# Patient Record
Sex: Male | Born: 1985 | Race: White | Hispanic: No | Marital: Married | Smoking: Never smoker
Health system: Southern US, Community
[De-identification: ages and names within clinical notes are randomized; demographics above are authoritative.]

## PROBLEM LIST (undated history)

## (undated) DIAGNOSIS — M545 Low back pain, unspecified: Secondary | ICD-10-CM

## (undated) DIAGNOSIS — I499 Cardiac arrhythmia, unspecified: Secondary | ICD-10-CM

---

## 2007-03-07 ENCOUNTER — Emergency Department: Payer: Self-pay | Admitting: Emergency Medicine

## 2007-03-10 ENCOUNTER — Emergency Department: Payer: Self-pay | Admitting: Emergency Medicine

## 2009-01-03 ENCOUNTER — Emergency Department: Payer: Self-pay | Admitting: Emergency Medicine

## 2009-04-27 ENCOUNTER — Emergency Department: Payer: Self-pay | Admitting: Emergency Medicine

## 2009-09-10 ENCOUNTER — Emergency Department: Payer: Self-pay | Admitting: Emergency Medicine

## 2009-09-12 ENCOUNTER — Emergency Department: Payer: Self-pay | Admitting: Emergency Medicine

## 2010-02-17 ENCOUNTER — Emergency Department: Payer: Self-pay | Admitting: Emergency Medicine

## 2010-08-31 ENCOUNTER — Encounter: Payer: Self-pay | Admitting: Family Medicine

## 2010-09-25 ENCOUNTER — Emergency Department: Payer: Self-pay | Admitting: Emergency Medicine

## 2010-09-27 ENCOUNTER — Encounter: Payer: Self-pay | Admitting: Family Medicine

## 2012-03-17 ENCOUNTER — Emergency Department: Payer: Self-pay | Admitting: Emergency Medicine

## 2012-03-22 ENCOUNTER — Emergency Department: Payer: Self-pay | Admitting: Emergency Medicine

## 2012-07-16 ENCOUNTER — Emergency Department: Payer: Self-pay | Admitting: Emergency Medicine

## 2013-09-30 ENCOUNTER — Ambulatory Visit: Payer: Self-pay | Admitting: Podiatry

## 2014-12-01 ENCOUNTER — Emergency Department: Payer: Self-pay | Admitting: Emergency Medicine

## 2014-12-01 LAB — URINALYSIS, COMPLETE
Bacteria: NONE SEEN
Bilirubin,UR: NEGATIVE
Blood: NEGATIVE
GLUCOSE, UR: NEGATIVE mg/dL (ref 0–75)
Ketone: NEGATIVE
LEUKOCYTE ESTERASE: NEGATIVE
NITRITE: NEGATIVE
Ph: 6 (ref 4.5–8.0)
Protein: NEGATIVE
SPECIFIC GRAVITY: 1.049 (ref 1.003–1.030)
Squamous Epithelial: <1

## 2014-12-01 LAB — CBC
HCT: 42.3 % (ref 40.0–52.0)
HGB: 14.5 g/dL (ref 13.0–18.0)
MCH: 30.8 pg (ref 26.0–34.0)
MCHC: 34.2 g/dL (ref 32.0–36.0)
MCV: 90 fL (ref 80–100)
PLATELETS: 187 10*3/uL (ref 150–440)
RBC: 4.69 10*6/uL (ref 4.40–5.90)
RDW: 13.3 % (ref 11.5–14.5)
WBC: 12.4 10*3/uL — ABNORMAL HIGH (ref 3.8–10.6)

## 2014-12-01 LAB — COMPREHENSIVE METABOLIC PANEL
ALBUMIN: 4 g/dL (ref 3.4–5.0)
ALK PHOS: 61 U/L
ANION GAP: 6 — AB (ref 7–16)
AST: 40 U/L — AB (ref 15–37)
BUN: 18 mg/dL (ref 7–18)
Bilirubin,Total: 0.7 mg/dL (ref 0.2–1.0)
CALCIUM: 9 mg/dL (ref 8.5–10.1)
CO2: 28 mmol/L (ref 21–32)
Chloride: 105 mmol/L (ref 98–107)
Creatinine: 1.23 mg/dL (ref 0.60–1.30)
EGFR (African American): >60
Glucose: 128 mg/dL — ABNORMAL HIGH (ref 65–99)
Osmolality: 281 (ref 275–301)
Potassium: 3.9 mmol/L (ref 3.5–5.1)
SGPT (ALT): 56 U/L
Sodium: 139 mmol/L (ref 136–145)
Total Protein: 7.5 g/dL (ref 6.4–8.2)

## 2015-10-20 ENCOUNTER — Emergency Department
Admission: EM | Admit: 2015-10-20 | Discharge: 2015-10-20 | Disposition: A | Discharge status: Home / Self Care | Payer: BLUE CROSS/BLUE SHIELD | Attending: Student | Admitting: Student

## 2015-10-20 ENCOUNTER — Encounter: Payer: Self-pay | Admitting: Emergency Medicine

## 2015-10-20 DIAGNOSIS — J029 Acute pharyngitis, unspecified: Secondary | ICD-10-CM | POA: Diagnosis present

## 2015-10-20 DIAGNOSIS — B9789 Other viral agents as the cause of diseases classified elsewhere: Secondary | ICD-10-CM | POA: Insufficient documentation

## 2015-10-20 DIAGNOSIS — J028 Acute pharyngitis due to other specified organisms: Secondary | ICD-10-CM | POA: Insufficient documentation

## 2015-10-20 LAB — POCT RAPID STREP A: Streptococcus, Group A Screen (Direct): NEGATIVE

## 2015-10-20 NOTE — ED Provider Notes (Signed)
[facility] Emergency Department Provider Note  ____________________________________________  Time seen: Approximately 1:48 PM  I have reviewed the triage vital signs and the nursing notes.   HISTORY  Chief Complaint Sore Throat   HPI Reginald Terrell is a 29 y.o. male patient is here today with complaint of sore throat which he states he woke up with this morning. He states that this improved after taking a hot shower. He also has had some nasal congestion. He denies any known fever. 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 Currently he rates his pain as 5 out of 10.   History reviewed. No pertinent past medical history.  There are no active problems to display for this patient.   History reviewed. No pertinent past surgical history.  No current outpatient prescriptions on file.  Allergies Review of patient's allergies indicates not on file.  No family history on file.  Social History Social History  Substance Use Topics  . Smoking status: Never Smoker   . Smokeless tobacco: None  . Alcohol Use: No    Review of Systems Constitutional: No fever/chills Eyes: No visual changes. ENT: Positive sore throat. Cardiovascular: Denies chest pain. Respiratory: Denies shortness of breath. Gastrointestinal: No abdominal pain.  No nausea, no vomiting.  Genitourinary: Negative for dysuria. Musculoskeletal: Negative for back pain. Skin: Negative for rash. Neurological: Negative for headaches, focal weakness or numbness.  10-point ROS otherwise negative.  ____________________________________________   PHYSICAL EXAM:  VITAL SIGNS: ED Triage Vitals  Enc Vitals Group     BP 10/20/15 1332 167/68 mmHg     Pulse Rate 10/20/15 1330 79     Resp 10/20/15 1330 20     Temp 10/20/15 1330 97.8 F (36.6 C)     Temp Source  10/20/15 1330 Oral     SpO2 10/20/15 1330 96 %     Weight 10/20/15 1330 208 lb (94.348 kg)     Height 10/20/15 1330  (1.905 m)     Head Cir --      Peak Flow --      Pain Score 10/20/15 1331 5     Pain Loc --      Pain Edu? --      Excl. in GC? --     Constitutional: Alert and oriented. Well appearing and in no acute distress. Eyes: Conjunctivae are normal. PERRL. EOMI. Head: Atraumatic. Nose: Mild congestion/no rhinnorhea. EACs and TMs are clear bilaterally. Mouth/Throat: Mucous membranes are moist.  Oropharynx non-erythematous. Mild posterior drainage was seen. No exudate is present. Neck: No stridor.   Hematological/Lymphatic/Immunilogical: No cervical lymphadenopathy. Cardiovascular: Normal rate, regular rhythm. Grossly normal heart sounds.  Good peripheral circulation. Respiratory: Normal respiratory effort.  No retractions. Lungs CTAB. Gastrointestinal: Soft and nontender. No distention.  Musculoskeletal: No lower extremity tenderness nor edema.   Neurologic:  Normal speech and language. No gross focal neurologic deficits are appreciated. No gait instability. Skin:  Skin is warm, dry and intact. No rash noted. Psychiatric: Mood and affect are normal. Speech and behavior are normal.  ____________________________________________   LABS (all labs ordered are listed, but only abnormal results are displayed)  Labs Reviewed  CULTURE, GROUP A STREP ([facility] ONLY)  POCT RAPID STREP A    PROCEDURES  Procedure(s) performed: None  Critical Care performed: No  ____________________________________________   INITIAL IMPRESSION /  ASSESSMENT AND PLAN / ED COURSE  Pertinent labs & imaging results that were available during my care of the patient were reviewed by me and considered in my medical decision making (see chart for details).  Patient was informed that his strep test was negative. There is no erythema or exudate present. Patient was told take Tylenol or ibuprofen as  needed for throat pain. He is to increase fluids. He is also to obtain a decongestant/antihistamine such as Claritin-D or Zyrtec-D for posterior drainage. ____________________________________________   FINAL CLINICAL IMPRESSION(S) / ED DIAGNOSES  Final diagnoses:  Acute viral pharyngitis      Tommi RumpsRhonda L Summers, PA-C 10/20/15 1440  Gayla DossEryka A Gayle, MD 10/20/15 1550

## 2015-10-20 NOTE — Discharge Instructions (Signed)
OBTAIN ZYRTEC D OR CLARITIN D  INCREASE FLUIDS, TYLENOL OR IBUPROFEN FOR THROAT PAIN AS NEEDED

## 2015-10-20 NOTE — ED Notes (Signed)
Pt presents with sore throat started this am, wife was recently dx with strep and treated with antibiotics.

## 2015-10-20 NOTE — ED Notes (Signed)
Pt reports sore throat since yesterday. Reports difficulty swallowing this morning. Pt states wife and child have recently been treated for strep throat.

## 2015-10-22 LAB — CULTURE, GROUP A STREP (THRC)

## 2016-05-31 ENCOUNTER — Emergency Department: Payer: Self-pay

## 2016-05-31 ENCOUNTER — Encounter: Payer: Self-pay | Admitting: Emergency Medicine

## 2016-05-31 ENCOUNTER — Emergency Department
Admission: EM | Admit: 2016-05-31 | Discharge: 2016-05-31 | Disposition: A | Discharge status: Home / Self Care | Payer: Self-pay | Attending: Emergency Medicine | Admitting: Emergency Medicine

## 2016-05-31 DIAGNOSIS — M7918 Myalgia, other site: Secondary | ICD-10-CM

## 2016-05-31 DIAGNOSIS — M791 Myalgia: Secondary | ICD-10-CM | POA: Insufficient documentation

## 2016-05-31 DIAGNOSIS — M545 Low back pain: Secondary | ICD-10-CM

## 2016-05-31 DIAGNOSIS — M5442 Lumbago with sciatica, left side: Secondary | ICD-10-CM | POA: Insufficient documentation

## 2016-05-31 LAB — URINALYSIS COMPLETE WITH MICROSCOPIC (ARMC ONLY)
BACTERIA UA: NONE SEEN
BILIRUBIN URINE: NEGATIVE
GLUCOSE, UA: NEGATIVE mg/dL
KETONES UR: NEGATIVE mg/dL
LEUKOCYTES UA: NEGATIVE
NITRITE: NEGATIVE
Protein, ur: NEGATIVE mg/dL
SPECIFIC GRAVITY, URINE: 1.027 (ref 1.005–1.030)
Squamous Epithelial / LPF: NONE SEEN
pH: 5 (ref 5.0–8.0)

## 2016-05-31 MED ORDER — DIAZEPAM 2 MG PO TABS
2.0000 mg | ORAL_TABLET | Freq: Once | ORAL | Status: AC
  Administered 2016-05-31: 2 mg via ORAL
  Filled 2016-05-31: qty 1

## 2016-05-31 MED ORDER — ETODOLAC 200 MG PO CAPS: 200.0000 mg | ORAL_CAPSULE | Freq: Three times a day (TID) | ORAL | Status: DC

## 2016-05-31 MED ORDER — LIDOCAINE 5 % EX PTCH: 1.0000 | MEDICATED_PATCH | Freq: Two times a day (BID) | CUTANEOUS | Status: AC

## 2016-05-31 MED ORDER — DIAZEPAM 2 MG PO TABS: 2.0000 mg | ORAL_TABLET | Freq: Three times a day (TID) | ORAL | Status: AC | PRN

## 2016-05-31 MED ORDER — KETOROLAC TROMETHAMINE 60 MG/2ML IM SOLN
60.0000 mg | Freq: Once | INTRAMUSCULAR | Status: AC
  Administered 2016-05-31: 60 mg via INTRAMUSCULAR
  Filled 2016-05-31: qty 2

## 2016-05-31 MED ORDER — LIDOCAINE 5 % EX PTCH
1.0000 | MEDICATED_PATCH | CUTANEOUS | Status: DC
  Administered 2016-05-31: 1 via TRANSDERMAL
  Filled 2016-05-31: qty 1

## 2016-05-31 NOTE — ED Provider Notes (Signed)
[facility] Emergency Department Provider Note   ____________________________________________  Time seen: Approximately 3:15 AM  I have reviewed the triage vital signs and the nursing notes.   HISTORY  Chief Complaint Back Pain    HPI Reginald Terrell is a 30 y.o. male who comes into the hospital today with severe back pain. The patient reports this started 2 days ago but it was not as bad initially. He reports that he bent over and when he stood up his back was hurting. He also reports that he also slipped and fell in the shower. He reports that he's been taking ibuprofen and did take a dose of Flexeril. He helped for a little bit but the pain came back. He reports that the pain is on his left side. He felt stabbing in his back on the left and into his hips. He reports he can't stand like normal but when he is laying on the bed he is okay. He reports that the pain is so bad as like a 15 out of 10 in intensity. The patient is here for evaluation.   History reviewed. No pertinent past medical history.  There are no active problems to display for this patient.   History reviewed. No pertinent past surgical history.  Current Outpatient Rx  Name  Route  Sig  Dispense  Refill  . diazepam (VALIUM) 2 MG tablet   Oral   Take 1 tablet (2 mg total) by mouth every 8 (eight) hours as needed for anxiety.   12 tablet   0   . etodolac (LODINE) 200 MG capsule   Oral   Take 1 capsule (200 mg total) by mouth every 8 (eight) hours.   12 capsule   0   . lidocaine (LIDODERM) 5 %   Transdermal   Place 1 patch onto the skin every 12 (twelve) hours. Remove & Discard patch within 12 hours or as directed by MD   10 patch   0     Allergies Penicillins  History reviewed. No pertinent family history.  Social History Social History  Substance Use Topics  . Smoking status: Never Smoker   . Smokeless tobacco: None  . Alcohol Use: No    Review of  Systems Constitutional: No fever/chills Eyes: No visual changes. ENT: No sore throat. Cardiovascular: Denies chest pain. Respiratory: Denies shortness of breath. Gastrointestinal: No abdominal pain.  No nausea, no vomiting.  No diarrhea.  No constipation. Genitourinary: Negative for dysuria. Musculoskeletal:  back pain. Skin: Negative for rash. Neurological: Negative for headaches, focal weakness or numbness.  10-point ROS otherwise negative.  ____________________________________________   PHYSICAL EXAM:  VITAL SIGNS: ED Triage Vitals  Enc Vitals Group     BP 05/31/16 0213 112/73 mmHg     Pulse Rate 05/31/16 0213 78     Resp 05/31/16 0213 24     Temp 05/31/16 0213 97.6 F (36.4 C)     Temp Source 05/31/16 0213 Oral     SpO2 05/31/16 0213 100 %     Weight 05/31/16 0213 215 lb (97.523 kg)     Height 05/31/16 0213 6\' 3"  (1.905 m)     Head Cir --      Peak Flow --      Pain Score 05/31/16 0225 10     Pain Loc --      Pain Edu? --      Excl. in GC? --     Constitutional: Alert and oriented. Well appearing  and in mild distress. Eyes: Conjunctivae are normal. PERRL. EOMI. Head: Atraumatic. Nose: No congestion/rhinnorhea. Mouth/Throat: Mucous membranes are moist.  Oropharynx non-erythematous. Cardiovascular: Normal rate, regular rhythm. Grossly normal heart sounds.  Good peripheral circulation. Respiratory: Normal respiratory effort.  No retractions. Lungs CTAB. Gastrointestinal: Soft and nontender. No distention. Positive bowel sounds Musculoskeletal: Left low back tenderness to palpation and some midline tenderness to palpation positive straight leg raise bilaterally   Neurologic:  Normal speech and language.  Skin:  Skin is warm, dry and intact.  Psychiatric: Mood and affect are normal.  ____________________________________________   LABS (all labs ordered are listed, but only abnormal results are displayed)  Labs Reviewed  URINALYSIS COMPLETEWITH MICROSCOPIC  ([facility] ONLY) - Abnormal; Notable for the following:    Color, Urine YELLOW (*)    APPearance CLEAR (*)    Hgb urine dipstick 1+ (*)    All other components within normal limits   ____________________________________________  EKG  none ____________________________________________  RADIOLOGY  Lumbar spine x-ray: No evidence of fracture or subluxation along the lumbar spine ____________________________________________   PROCEDURES  Procedure(s) performed: None  Procedures  Critical Care performed: No  ____________________________________________   INITIAL IMPRESSION / ASSESSMENT AND PLAN / ED COURSE  Pertinent labs & imaging results that were available during my care of the patient were reviewed by me and considered in my medical decision making (see chart for details).  This is a 30 year old male who comes into the hospital today with low back pain. The patient reports that he bent over and felt something pulling in his back which is when he started having pain. The patient also did falls I will do an x-ray of his back. He'll receive a dose of Toradol, Valium and a Lidoderm patch. He will be reassessed.  Patient's pain is improved. His urinalysis is unremarkable. He'll be discharged home to follow-up with his primary care physician. ____________________________________________   FINAL CLINICAL IMPRESSION(S) / ED DIAGNOSES  Final diagnoses:  Left low back pain, with sciatica presence unspecified  Musculoskeletal pain      NEW MEDICATIONS STARTED DURING THIS VISIT:  Discharge Medication List as of 05/31/2016  6:17 AM    START taking these medications   Details  diazepam (VALIUM) 2 MG tablet Take 1 tablet (2 mg total) by mouth every 8 (eight) hours as needed for anxiety., Starting 05/31/2016, Until Thu 05/31/17, Print    etodolac (LODINE) 200 MG capsule Take 1 capsule (200 mg total) by mouth every 8 (eight) hours., Starting 05/31/2016, Until Discontinued, Print     lidocaine (LIDODERM) 5 % Place 1 patch onto the skin every 12 (twelve) hours. Remove & Discard patch within 12 hours or as directed by MD, Starting 05/31/2016, Until Thu 05/31/17, Print         Note:  This document was prepared using Dragon voice recognition software and may include unintentional dictation errors.    Rebecka ApleyAllison P Rachel Samples, MD 05/31/16 [phone]

## 2016-05-31 NOTE — ED Notes (Signed)
Discharge instructions reviewed with patient. Patient verbalized understanding. Patient taken to lobby via wheelchair to await wife to pick him up

## 2016-05-31 NOTE — ED Notes (Signed)
Pt via POV to triage c/o lower back pain, pain radiates to pt's gluteal and leg, 10/10, pt laying on floor in triage, reports pain started this am, pt took another's flexaril at midnight without effect.

## 2016-05-31 NOTE — Discharge Instructions (Signed)
Back Pain, Adult °Back pain is very common in adults. The cause of back pain is rarely dangerous and the pain often gets better over time. The cause of your back pain may not be known. Some common causes of back pain include: °· Strain of the muscles or ligaments supporting the spine. °· Wear and tear (degeneration) of the spinal disks. °· Arthritis. °· Direct injury to the back. °For many people, back pain may return. Since back pain is rarely dangerous, most people can learn to manage this condition on their own. °HOME CARE INSTRUCTIONS °Watch your back pain for any changes. The following actions may help to lessen any discomfort you are feeling: °· Remain active. It is stressful on your back to sit or stand in one place for long periods of time. Do not sit, drive, or stand in one place for more than 30 minutes at a time. Take short walks on even surfaces as soon as you are able. Try to increase the length of time you walk each day. °· Exercise regularly as directed by your health care provider. Exercise helps your back heal faster. It also helps avoid future injury by keeping your muscles strong and flexible. °· Do not stay in bed. Resting more than 1-2 days can delay your recovery. °· Pay attention to your body when you bend and lift. The most comfortable positions are those that put less stress on your recovering back. Always use proper lifting techniques, including: °· Bending your knees. °· Keeping the load close to your body. °· Avoiding twisting. °· Find a comfortable position to sleep. Use a firm mattress and lie on your side with your knees slightly bent. If you lie on your back, put a pillow under your knees. °· Avoid feeling anxious or stressed. Stress increases muscle tension and can worsen back pain. It is important to recognize when you are anxious or stressed and learn ways to manage it, such as with exercise. °· Take medicines only as directed by your health care provider. Over-the-counter  medicines to reduce pain and inflammation are often the most helpful. Your health care provider may prescribe muscle relaxant drugs. These medicines help dull your pain so you can more quickly return to your normal activities and healthy exercise. °· Apply ice to the injured area: °· Put ice in a plastic bag. °· Place a towel between your skin and the bag. °· Leave the ice on for 20 minutes, 2-3 times a day for the first 2-3 days. After that, ice and heat may be alternated to reduce pain and spasms. °· Maintain a healthy weight. Excess weight puts extra stress on your back and makes it difficult to maintain good posture. °SEEK MEDICAL CARE IF: °· You have pain that is not relieved with rest or medicine. °· You have increasing pain going down into the legs or buttocks. °· You have pain that does not improve in one week. °· You have night pain. °· You lose weight. °· You have a fever or chills. °SEEK IMMEDIATE MEDICAL CARE IF:  °· You develop new bowel or bladder control problems. °· You have unusual weakness or numbness in your arms or legs. °· You develop nausea or vomiting. °· You develop abdominal pain. °· You feel faint. °  °This information is not intended to replace advice given to you by your health care provider. Make sure you discuss any questions you have with your health care provider. °  °Document Released: 11/13/2005 Document Revised: 12/04/2014 Document Reviewed: 03/17/2014 °Elsevier Interactive Patient Education ©2016 [employer]   Inc[employer]  Musculoskeletal Pain Musculoskeletal pain is muscle and boney aches and pains. These pains can occur in any part of the body. Your caregiver may treat you without knowing the cause of the pain. They may treat you if blood or urine tests, X-rays, and other tests were normal.  CAUSES There is often not a definite cause or reason for these pains. These pains may be caused by a type of germ (virus). The discomfort may also come from overuse. Overuse includes working out  too hard when your body is not fit. Boney aches also come from weather changes. Bone is sensitive to atmospheric pressure changes. HOME CARE INSTRUCTIONS   Ask when your test results will be ready. Make sure you get your test results.  Only take over-the-counter or prescription medicines for pain, discomfort, or fever as directed by your caregiver. If you were given medications for your condition, do not drive, operate machinery or power tools, or sign legal documents for 24 hours. Do not drink alcohol. Do not take sleeping pills or other medications that may interfere with treatment.  Continue all activities unless the activities cause more pain. When the pain lessens, slowly resume normal activities. Gradually increase the intensity and duration of the activities or exercise.  During periods of severe pain, bed rest may be helpful. Lay or sit in any position that is comfortable.  Putting ice on the injured area.  Put ice in a bag.  Place a towel between your skin and the bag.  Leave the ice on for 15 to 20 minutes, 3 to 4 times a day.  Follow up with your caregiver for continued problems and no reason can be found for the pain. If the pain becomes worse or does not go away, it may be necessary to repeat tests or do additional testing. Your caregiver may need to look further for a possible cause. SEEK IMMEDIATE MEDICAL CARE IF:  You have pain that is getting worse and is not relieved by medications.  You develop chest pain that is associated with shortness or breath, sweating, feeling sick to your stomach (nauseous), or throw up (vomit).  Your pain becomes localized to the abdomen.  You develop any new symptoms that seem different or that concern you. MAKE SURE YOU:   Understand these instructions.  Will watch your condition.  Will get help right away if you are not doing well or get worse.   This information is not intended to replace advice given to you by your health care  provider. Make sure you discuss any questions you have with your health care provider.   Document Released: 11/13/2005 Document Revised: 02/05/2012 Document Reviewed: 07/18/2013 Elsevier Interactive Patient Education 2016 [employer] Inc.  Radicular Pain Radicular pain in either the arm or leg is usually from a bulging or herniated disk in the spine. A piece of the herniated disk may press against the nerves as the nerves exit the spine. This causes pain which is felt at the tips of the nerves down the arm or leg. Other causes of radicular pain may include:  Fractures.  Heart disease.  Cancer.  An abnormal and usually degenerative state of the nervous system or nerves (neuropathy). Diagnosis may require CT or MRI scanning to determine the primary cause.  Nerves that start at the neck (nerve roots) may cause radicular pain in the outer shoulder and arm. It can spread down to the thumb and fingers. The symptoms vary depending on which nerve root has been affected.  In most cases radicular pain improves with conservative treatment. Neck problems may require physical therapy, a neck collar, or cervical traction. Treatment may take many weeks, and surgery may be considered if the symptoms do not improve.  Conservative treatment is also recommended for sciatica. Sciatica causes pain to radiate from the lower back or buttock area down the leg into the foot. Often there is a history of back problems. Most patients with sciatica are better after 2 to 4 weeks of rest and other supportive care. Short term bed rest can reduce the disk pressure considerably. Sitting, however, is not a good position since this increases the pressure on the disk. You should avoid bending, lifting, and all other activities which make the problem worse. Traction can be used in severe cases. Surgery is usually reserved for patients who do not improve within the first months of treatment. Only take over-the-counter or prescription  medicines for pain, discomfort, or fever as directed by your caregiver. Narcotics and muscle relaxants may help by relieving more severe pain and spasm and by providing mild sedation. Cold or massage can give significant relief. Spinal manipulation is not recommended. It can increase the degree of disc protrusion. Epidural steroid injections are often effective treatment for radicular pain. These injections deliver medicine to the spinal nerve in the space between the protective covering of the spinal cord and back bones (vertebrae). Your caregiver can give you more information about steroid injections. These injections are most effective when given within two weeks of the onset of pain.  You should see your caregiver for follow up care as recommended. A program for neck and back injury rehabilitation with stretching and strengthening exercises is an important part of management.  SEEK IMMEDIATE MEDICAL CARE IF:  You develop increased pain, weakness, or numbness in your arm or leg.  You develop difficulty with bladder or bowel control.  You develop abdominal pain.   This information is not intended to replace advice given to you by your health care provider. Make sure you discuss any questions you have with your health care provider.   Document Released: 12/21/2004 Document Revised: 12/04/2014 Document Reviewed: 06/09/2015 Elsevier Interactive Patient Education [employer]

## 2016-12-22 ENCOUNTER — Emergency Department: Payer: Self-pay

## 2016-12-22 ENCOUNTER — Emergency Department
Admission: EM | Admit: 2016-12-22 | Discharge: 2016-12-22 | Disposition: A | Discharge status: Home / Self Care | Payer: Self-pay | Attending: Emergency Medicine | Admitting: Emergency Medicine

## 2016-12-22 ENCOUNTER — Encounter: Payer: Self-pay | Admitting: Emergency Medicine

## 2016-12-22 DIAGNOSIS — M25511 Pain in right shoulder: Secondary | ICD-10-CM | POA: Insufficient documentation

## 2016-12-22 HISTORY — DX: Cardiac arrhythmia, unspecified: I49.9

## 2016-12-22 LAB — CBC
HEMATOCRIT: 43.1 % (ref 40.0–52.0)
HEMOGLOBIN: 15.6 g/dL (ref 13.0–18.0)
MCH: 31 pg (ref 26.0–34.0)
MCHC: 36.1 g/dL — ABNORMAL HIGH (ref 32.0–36.0)
MCV: 85.9 fL (ref 80.0–100.0)
Platelets: 230 10*3/uL (ref 150–440)
RBC: 5.02 MIL/uL (ref 4.40–5.90)
RDW: 12.6 % (ref 11.5–14.5)
WBC: 7.4 10*3/uL (ref 3.8–10.6)

## 2016-12-22 LAB — BASIC METABOLIC PANEL
ANION GAP: 9 (ref 5–15)
BUN: 22 mg/dL — AB (ref 6–20)
CHLORIDE: 104 mmol/L (ref 101–111)
CO2: 24 mmol/L (ref 22–32)
Calcium: 9.6 mg/dL (ref 8.9–10.3)
Creatinine, Ser: 1.09 mg/dL (ref 0.61–1.24)
GFR calc Af Amer: >60 mL/min (ref >60)
GLUCOSE: 103 mg/dL — AB (ref 65–99)
POTASSIUM: 4.2 mmol/L (ref 3.5–5.1)
Sodium: 137 mmol/L (ref 135–145)

## 2016-12-22 LAB — TROPONIN I: Troponin I: <0.03 ng/mL (ref <0.03)

## 2016-12-22 MED ORDER — KETOROLAC TROMETHAMINE 30 MG/ML IJ SOLN
30.0000 mg | Freq: Once | INTRAMUSCULAR | Status: AC
  Administered 2016-12-22: 30 mg via INTRAVENOUS
  Filled 2016-12-22: qty 1

## 2016-12-22 MED ORDER — TRAMADOL HCL 50 MG PO TABS: 50.0000 mg | ORAL_TABLET | Freq: Four times a day (QID) | ORAL | 0 refills | Status: DC | PRN

## 2016-12-22 NOTE — ED Provider Notes (Signed)
[facility] Emergency Department Provider Note  Time seen: 9:08 AM  I have reviewed the triage vital signs and the nursing notes.   HISTORY  Chief Complaint Chest Pain and Arm Pain    HPI Reginald Terrell is a 31 y.o. male with a past medical history of an irregular heartbeat who presents to the emergency department for right shoulder discomfort. According to the patient for the past 3 days he has been experiencing discomfort in the right shoulder which she describes as a burning sensation. He states occasionally the pain radiates into the right chest. Denies any worsening with arm movement. Denies any pain with deep inspiration. Denies any shortness of breath nausea or diaphoresis. Denies any past cardiac issues besides an irregular heartbeat which she describes as palpitations for which he has a cardiology appointment coming up.  Past Medical History:  Diagnosis Date  . Irregular heart beat     There are no active problems to display for this patient.   History reviewed. No pertinent surgical history.  Prior to Admission medications   Medication Sig Start Date End Date Taking? Authorizing Provider  diazepam (VALIUM) 2 MG tablet Take 1 tablet (2 mg total) by mouth every 8 (eight) hours as needed for anxiety. 05/31/16 05/31/17  Rebecka Apley, MD  etodolac (LODINE) 200 MG capsule Take 1 capsule (200 mg total) by mouth every 8 (eight) hours. 05/31/16   Rebecka Apley, MD  lidocaine (LIDODERM) 5 % Place 1 patch onto the skin every 12 (twelve) hours. Remove & Discard patch within 12 hours or as directed by MD 05/31/16 05/31/17  Rebecka Apley, MD    Allergies  Allergen Reactions  . Penicillins Hives    No family history on file.  Social History Social History  Substance Use Topics  . Smoking status: Never Smoker  . Smokeless tobacco: Never Used  . Alcohol use No    Review of Systems Constitutional: Negative for fever. Cardiovascular: Negative for  chest pain.Positive for right shoulder pain. Respiratory: Negative for shortness of breath. Gastrointestinal: Negative for abdominal pain Neurological: Negative for headache 10-point ROS otherwise negative.  ____________________________________________   PHYSICAL EXAM:  VITAL SIGNS: ED Triage Vitals  Enc Vitals Group     BP 12/22/16 0828 (!) 147/86     Pulse Rate 12/22/16 0828 89     Resp 12/22/16 0828 20     Temp 12/22/16 0828 97.7 F (36.5 C)     Temp Source 12/22/16 0828 Oral     SpO2 12/22/16 0828 97 %     Weight 12/22/16 0828 215 lb (97.5 kg)     Height 12/22/16 0828 6\' 3"  (1.905 m)     Head Circumference --      Peak Flow --      Pain Score 12/22/16 0829 10     Pain Loc --      Pain Edu? --      Excl. in GC? --     Constitutional: Alert and oriented. Well appearing and in no distress. Eyes: Normal exam ENT   Head: Normocephalic and atraumatic.   Mouth/Throat: Mucous membranes are moist. Cardiovascular: Normal rate, regular rhythm. No murmur Respiratory: Normal respiratory effort without tachypnea nor retractions. Breath sounds are clear  Gastrointestinal: Soft and nontender. No distention. Musculoskeletal: Nontender right shoulder, good range of motion in the shoulder, neurovascularly intact. No edema, no erythema. Normal appearance. Patient does have a fracture boot in the left lower extremity denies any leg pain  or swelling. Neurologic:  Normal speech and language. No gross focal neurologic deficits  Skin:  Skin is warm, dry and intact.  Psychiatric: Mood and affect are normal.   ____________________________________________    EKG  EKG reviewed and interpreted by myself shows normal sinus rhythm at 83 bpm. Narrow QRS, normal axis, normal intervals, no ST changes. Normal EKG.  ____________________________________________    RADIOLOGY  Chest x-ray negative.  ____________________________________________   INITIAL IMPRESSION / ASSESSMENT AND PLAN  / ED COURSE  Pertinent labs & imaging results that were available during my care of the patient were reviewed by me and considered in my medical decision making (see chart for details).  Patient presents for several days of right-sided shoulder pain. On exam patient appears well, no tenderness with good range of motion. Denies any current chest pain but states earlier it was radiating into the right side of his chest. Denies any pleuritic pain. Denies any shortness of breath. Patient has normal vitals. Well appearing x-ray. Well appearing EKG. Labs including troponin are normal.   We will discharge the short course of Ultram and PCP follow-up. Discussed cardiology follow-up for any worsening discomfort especially chest discomfort.  ____________________________________________   FINAL CLINICAL IMPRESSION(S) / ED DIAGNOSES  Right shoulder pain   Minna AntisKevin Jiovanny Burdell, MD 12/22/16 1006

## 2016-12-22 NOTE — Discharge Instructions (Signed)
Please take her pain medication as needed, as prescribed. Please follow-up with your primary care doctor in 2-3 days for recheck/reevaluation. If he continued to have discomfort especially with any discomfort into the chest please call the number provided for cardiology to arrange a follow-up appointment to discuss further workup. Return to the emergency department for any worsening pain, chest pain, shortness of breath/trouble breathing, or any other symptom personally concerning to yourself.

## 2016-12-22 NOTE — ED Triage Notes (Signed)
Pt in via POV with complaints of a burning sensation through right arm and up into right chest, worsening over the last three days.  Pt reports associated shortness of breath and dizziness; denies any other symptoms.  No immediate distress noted at this time.

## 2016-12-22 NOTE — ED Notes (Signed)
Patient c/o burning pain in his right arm and radiates into the right side of his chest. Patient states that he has had the pain for 3 days but it got worse this morning. Patient denies any known injury to the area. Denies heavy lifting. Patient is able to move arm without difficulty.

## 2018-09-27 ENCOUNTER — Encounter: Payer: Self-pay | Admitting: Emergency Medicine

## 2018-09-27 ENCOUNTER — Emergency Department: Payer: Self-pay

## 2018-09-27 ENCOUNTER — Emergency Department
Admission: EM | Admit: 2018-09-27 | Discharge: 2018-09-27 | Disposition: A | Discharge status: Home / Self Care | Payer: Self-pay | Attending: Emergency Medicine | Admitting: Emergency Medicine

## 2018-09-27 ENCOUNTER — Other Ambulatory Visit: Payer: Self-pay

## 2018-09-27 DIAGNOSIS — M70821 Other soft tissue disorders related to use, overuse and pressure, right upper arm: Secondary | ICD-10-CM | POA: Insufficient documentation

## 2018-09-27 DIAGNOSIS — M779 Enthesopathy, unspecified: Secondary | ICD-10-CM

## 2018-09-27 DIAGNOSIS — Y939 Activity, unspecified: Secondary | ICD-10-CM | POA: Insufficient documentation

## 2018-09-27 MED ORDER — KETOROLAC TROMETHAMINE 10 MG PO TABS
10.0000 mg | ORAL_TABLET | Freq: Once | ORAL | Status: AC
  Administered 2018-09-27: 10 mg via ORAL
  Filled 2018-09-27: qty 1

## 2018-09-27 MED ORDER — IBUPROFEN 800 MG PO TABS: 800.0000 mg | ORAL_TABLET | Freq: Three times a day (TID) | ORAL | 0 refills | Status: DC | PRN

## 2018-09-27 MED ORDER — PREDNISONE 20 MG PO TABS
60.0000 mg | ORAL_TABLET | Freq: Once | ORAL | Status: AC
  Administered 2018-09-27: 60 mg via ORAL
  Filled 2018-09-27: qty 3

## 2018-09-27 NOTE — ED Provider Notes (Signed)
[facility] Emergency Department Provider Note    First MD Initiated Contact with Patient 09/27/18 [phone]     (approximate)  I have reviewed the triage vital signs and the nursing notes.   HISTORY  Chief Complaint Hand Pain    HPI RONELL DUFFUS is a 32 y.o. male presents to the emergency department with nontraumatic right forearm/wrist pain x1 week.  Patient states pain is worse with any movement of the elbow/wrist.  Patient denies any fever.  Patient denies any weakness to the hand.  Past Medical History:  Diagnosis Date  . Irregular heart beat     There are no active problems to display for this patient.   History reviewed. No pertinent surgical history.  Prior to Admission medications   Medication Sig Start Date End Date Taking? Authorizing Provider  etodolac (LODINE) 200 MG capsule Take 1 capsule (200 mg total) by mouth every 8 (eight) hours. Patient not taking: Reported on 12/22/2016 05/31/16   Rebecka Apley, MD  ibuprofen (ADVIL,MOTRIN) 800 MG tablet Take 800 mg by mouth daily as needed. 09/27/15   [provider]  traMADol (ULTRAM) 50 MG tablet Take 1 tablet (50 mg total) by mouth every 6 (six) hours as needed. 12/22/16   Minna Antis, MD    Allergies Penicillins  No family history on file.  Social History Social History   Tobacco Use  . Smoking status: Never Smoker  . Smokeless tobacco: Never Used  Substance Use Topics  . Alcohol use: No  . Drug use: No    Review of Systems Constitutional: No fever/chills Eyes: No visual changes. ENT: No sore throat. Cardiovascular: Denies chest pain. Respiratory: Denies shortness of breath. Gastrointestinal: No abdominal pain.  No nausea, no vomiting.  No diarrhea.  No constipation. Genitourinary: Negative for dysuria. Musculoskeletal: Negative for neck pain.  Negative for back pain.  Positive for right arm/wrist pain Integumentary: Negative for rash. Neurological:  Negative for headaches, focal weakness or numbness.  ____________________________________________   PHYSICAL EXAM:  VITAL SIGNS: ED Triage Vitals  Enc Vitals Group     BP 09/27/18 0408 (!) 150/73     Pulse Rate 09/27/18 0408 83     Resp 09/27/18 0408 20     Temp 09/27/18 0408 98 F (36.7 C)     Temp Source 09/27/18 0408 Oral     SpO2 09/27/18 0408 97 %     Weight 09/27/18 0407 99.8 kg (220 lb)     Height 09/27/18 0407 1.905 m (6\' 3" )     Head Circumference --      Peak Flow --      Pain Score 09/27/18 0407 10     Pain Loc --      Pain Edu? --      Excl. in GC? --     Constitutional: Alert and oriented. Well appearing and in no acute distress. Eyes: Conjunctivae are normal.  Mouth/Throat: Mucous membranes are moist.  Oropharynx non-erythematous. Neck: No stridor.   Cardiovascular: Normal rate, regular rhythm. Good peripheral circulation. Grossly normal heart sounds. Respiratory: Normal respiratory effort.  No retractions. Lungs CTAB. Gastrointestinal: Soft and nontender. No distention.  Musculoskeletal: Pain with active and passive range of motion of the  right wrist/pronation and supination of the right forearm. Neurologic:  Normal speech and language. No gross focal neurologic deficits are appreciated.  Skin:  Skin is warm, dry and intact. No rash noted.   _____________________________  RADIOLOGY I, Darci Current, personally viewed  and evaluated these images (plain radiographs) as part of my medical decision making, as well as reviewing the written report by the radiologist.  ED MD interpretation: Right elbow x-ray negative  Official radiology report(s): Dg Elbow 2 Views Right  Result Date: 09/27/2018 CLINICAL DATA:  Initial evaluation for acute elbow pain.  No injury. EXAM: RIGHT ELBOW - 2 VIEW COMPARISON:  None. FINDINGS: No acute fracture dislocation. No joint effusion. Minimal degenerative spurring at the olecranon. Radial head intact. Osseous mineralization  normal. No soft tissue abnormality. IMPRESSION: No acute osseous abnormality about the right elbow. Electronically Signed   By: Rise Mu M.D.   On: 09/27/2018 06:16      Procedures   ____________________________________________   INITIAL IMPRESSION / ASSESSMENT AND PLAN / ED COURSE  As part of my medical decision making, I reviewed the following data within the electronic MEDICAL RECORD NUMBER  32 year old male presenting with above-stated history and physical exam consistent with possible tendinitis most likely extensor carpi all naris.  X-ray revealed no abnormal abnormality of the elbow.  Patient given a wrist splint and Toradol 10 mg tablet.  She will be prescribed ibuprofen 800 mg for home.    ____________________________________________  FINAL CLINICAL IMPRESSION(S) / ED DIAGNOSES  Final diagnoses:  Tendinitis     MEDICATIONS GIVEN DURING THIS VISIT:  Medications  predniSONE (DELTASONE) tablet 60 mg (60 mg Oral Given 09/27/18 0640)  ketorolac (TORADOL) tablet 10 mg (10 mg Oral Given 09/27/18 0640)     ED Discharge Orders    None       Note:  This document was prepared using Dragon voice recognition software and may include unintentional dictation errors.    Darci Current, MD 09/27/18 [phone]

## 2018-09-27 NOTE — ED Triage Notes (Signed)
Patient ambulatory to triage with steady gait, without difficulty or distress noted; pt reports x wk having pain to palm of right hand that radiates up arm into elbow; denies any known injury, denies any accomp symptoms

## 2020-01-16 IMAGING — DX DG ELBOW 2V*R*
2 series · 2 of 2 positions shown · non-contrast
Comparison: None.

CLINICAL DATA: Initial evaluation for acute elbow pain.  No injury.

EXAM:
RIGHT ELBOW - 2 VIEW

[elbow ap]
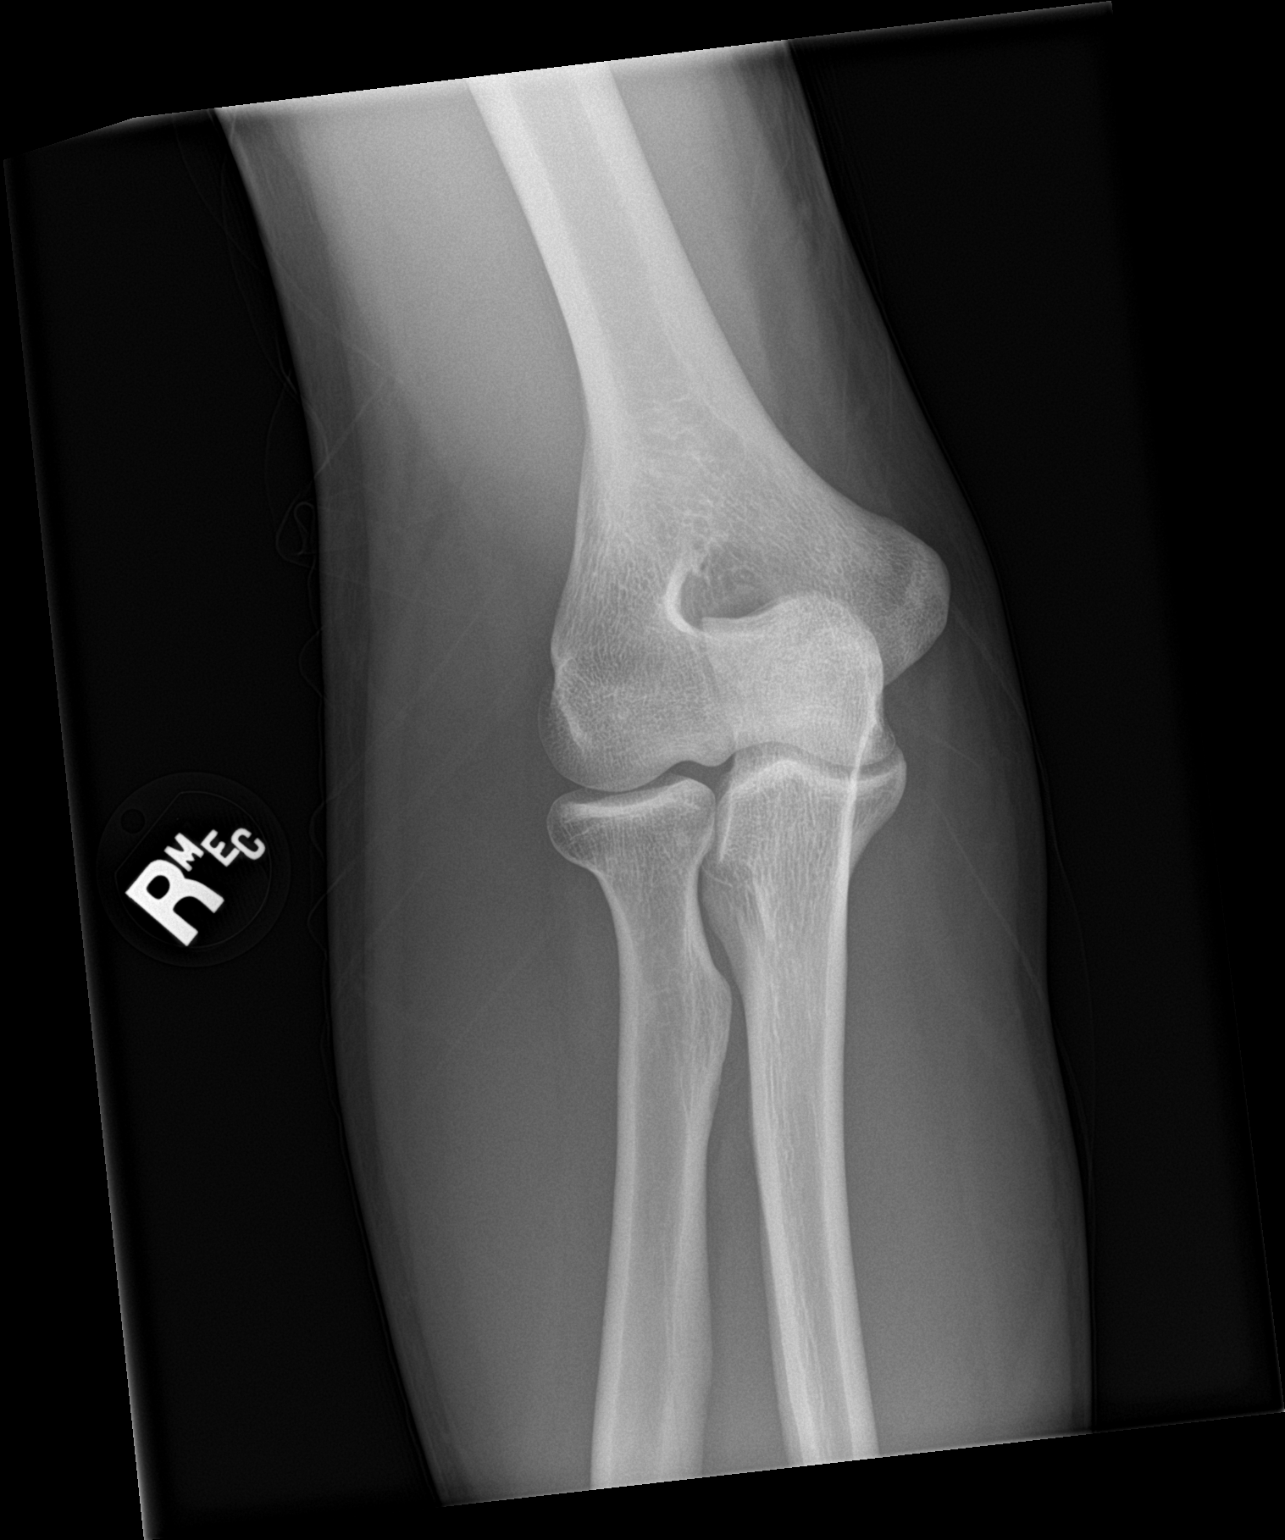

[elbow lat]
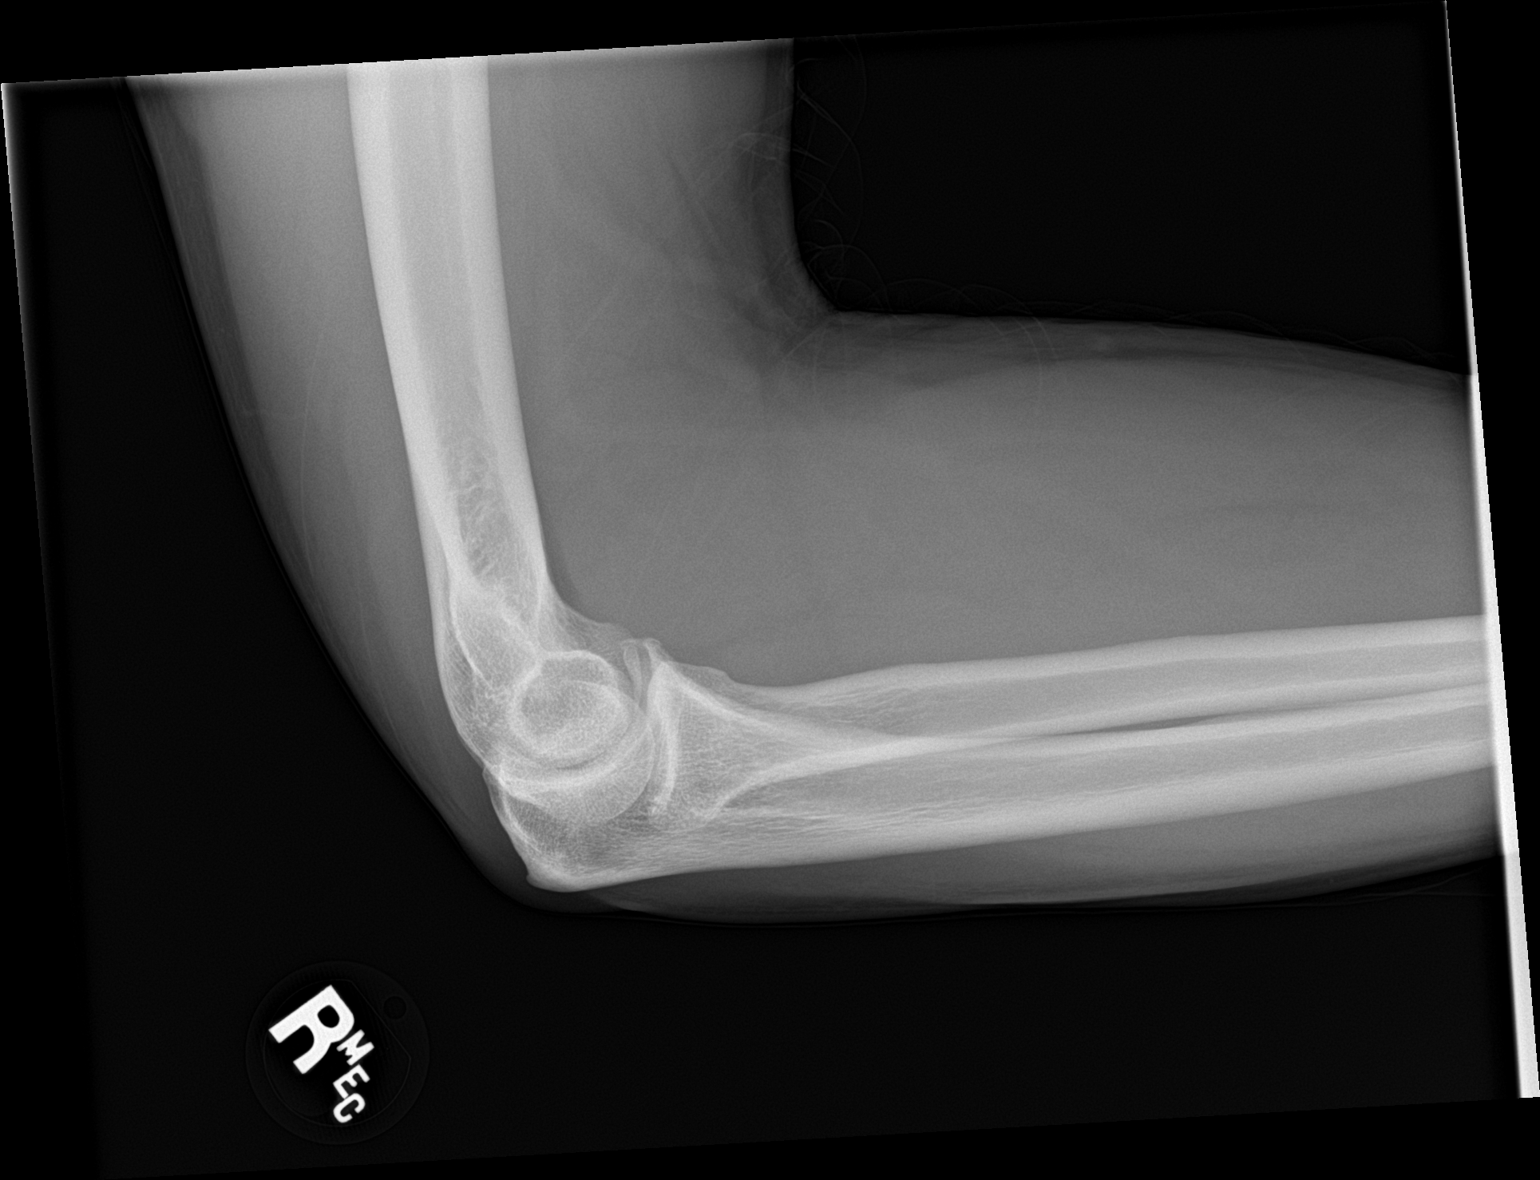

[2 of 2 positions shown; findings below may reference images not displayed]

FINDINGS: No acute fracture dislocation. No joint effusion. Minimal
degenerative spurring at the olecranon. Radial head intact. Osseous
mineralization normal. No soft tissue abnormality.
IMPRESSION: No acute osseous abnormality about the right elbow.

## 2021-05-31 ENCOUNTER — Other Ambulatory Visit: Payer: Self-pay

## 2021-05-31 ENCOUNTER — Ambulatory Visit
Admission: EM | Admit: 2021-05-31 | Discharge: 2021-05-31 | Disposition: A | Discharge status: Home / Self Care | Payer: Self-pay | Attending: Physician Assistant | Admitting: Physician Assistant

## 2021-05-31 ENCOUNTER — Ambulatory Visit: Admit: 2021-05-31 | Payer: Self-pay

## 2021-05-31 DIAGNOSIS — L03115 Cellulitis of right lower limb: Secondary | ICD-10-CM

## 2021-05-31 DIAGNOSIS — M79604 Pain in right leg: Secondary | ICD-10-CM

## 2021-05-31 DIAGNOSIS — R6883 Chills (without fever): Secondary | ICD-10-CM

## 2021-05-31 MED ORDER — KETOROLAC TROMETHAMINE 60 MG/2ML IM SOLN
60.0000 mg | Freq: Once | INTRAMUSCULAR | Status: AC
  Administered 2021-05-31: 60 mg via INTRAMUSCULAR

## 2021-05-31 MED ORDER — DOXYCYCLINE HYCLATE 100 MG PO CAPS: 100.0000 mg | ORAL_CAPSULE | Freq: Two times a day (BID) | ORAL | 0 refills | Status: AC

## 2021-05-31 MED ORDER — IBUPROFEN 800 MG PO TABS: 800.0000 mg | ORAL_TABLET | Freq: Three times a day (TID) | ORAL | 0 refills | Status: DC | PRN

## 2021-05-31 NOTE — Discharge Instructions (Addendum)
You have an infection of your lower leg.  Unsure if this is related to the tick bite.  I have sent in an antibiotic.  I have also sent in 800 mg ibuprofen.  Put warm compresses on the lower leg and try to keep it elevated.  You can also take Tylenol for pain.  This should start looking better in the next couple of days but if it does not or you have increased pain or fever you need to go to the emergency department.

## 2021-05-31 NOTE — ED Provider Notes (Signed)
[facility]    CSN: [id number] Arrival date & time: 05/31/21  1911      History   Chief Complaint Chief Complaint  Patient presents with   Insect Bite    HPI Reginald Terrell is a 35 y.o. male presenting for pustule and redness of the right anterior lower leg since yesterday.  He states the redness is worse today.  He says that he felt severe pain radiating up to his right thigh.  No numbness, tingling or weakness.  Patient says he pulled a tick off of the same area about a week ago.  He has had chills and low-grade fevers.  Temperature in clinic is 98.7 degrees.  Patient says that he took ibuprofen yesterday and he also took 1 Bactrim DS this morning from his girlfriend.  Patient denies any sort of injury.  He is not taking any other medications.  He has no other complaints.  Patient did go to [facility] ED today but left due to the wait time.  He had labs drawn and a COVID test.  Patient does not know the results yet.  HPI  Past Medical History:  Diagnosis Date   Irregular heart beat     There are no problems to display for this patient.   History reviewed. No pertinent surgical history.     Home Medications    Prior to Admission medications   Medication Sig Start Date End Date Taking? Authorizing Provider  doxycycline (VIBRAMYCIN) 100 MG capsule Take 1 capsule (100 mg total) by mouth 2 (two) times daily for 10 days. 05/31/21 06/10/21 Yes Shirlee Latch, PA-C  ibuprofen (ADVIL) 800 MG tablet Take 1 tablet (800 mg total) by mouth every 8 (eight) hours as needed for moderate pain or fever. 05/31/21  Yes Shirlee Latch, PA-C    Family History Family History  Problem Relation Age of Onset   Healthy Mother    Healthy Father     Social History Social History   Tobacco Use   Smoking status: Never   Smokeless tobacco: Never  Substance Use Topics   Alcohol use: No   Drug use: No     Allergies   Penicillins   Review of Systems Review of Systems   Constitutional:  Positive for chills and fatigue. Negative for fever.  HENT:  Negative for congestion.   Respiratory:  Negative for cough and shortness of breath.   Cardiovascular:  Negative for chest pain.  Musculoskeletal:  Positive for arthralgias. Negative for gait problem and joint swelling.  Skin:  Positive for color change and rash.  Neurological:  Negative for dizziness and headaches.    Physical Exam Triage Vital Signs ED Triage Vitals  Enc Vitals Group     BP 05/31/21 1940 (!) 141/90     Pulse Rate 05/31/21 1940 83     Resp 05/31/21 1940 19     Temp 05/31/21 1940 99.7 F (37.6 C)     Temp src --      SpO2 05/31/21 1940 99 %     Weight --      Height --      Head Circumference --      Peak Flow --      Pain Score 05/31/21 1938 10     Pain Loc --      Pain Edu? --      Excl. in GC? --    No data found.  Updated Vital Signs BP (!) 141/90  Pulse 83   Temp 99.7 F (37.6 C)   Resp 19   SpO2 99%       Physical Exam Vitals and nursing note reviewed.  Constitutional:      General: He is not in acute distress.    Appearance: Normal appearance. He is well-developed. He is not ill-appearing.     Comments: Patient has chills  HENT:     Head: Normocephalic and atraumatic.  Eyes:     General: No scleral icterus.    Conjunctiva/sclera: Conjunctivae normal.  Cardiovascular:     Rate and Rhythm: Normal rate and regular rhythm.     Heart sounds: Normal heart sounds.  Pulmonary:     Effort: Pulmonary effort is normal. No respiratory distress.     Breath sounds: Normal breath sounds.  Musculoskeletal:     Cervical back: Neck supple.  Skin:    General: Skin is warm and dry.     Findings: Lesion present.     Comments: RIGHT LOWER EXT: There is a small pustule of the right anterior lower leg with surrounding erythema and warmth as well as induration without any fluctuance.  Area is diffusely tender.  He also has tenderness of his medial thigh but no streaking up  the leg or erythema of the thigh.  Full range of motion of the knee and hip.  Good strength and sensation.  Neurological:     General: No focal deficit present.     Mental Status: He is alert. Mental status is at baseline.  Psychiatric:        Mood and Affect: Mood normal.        Behavior: Behavior normal.        Thought Content: Thought content normal.     UC Treatments / Results  Labs (all labs ordered are listed, but only abnormal results are displayed) Labs Reviewed - No data to display  EKG   Radiology No results found.  Procedures Procedures (including critical care time)  Medications Ordered in UC Medications  ketorolac (TORADOL) injection 60 mg (60 mg Intramuscular Given 05/31/21 2009)    Initial Impression / Assessment and Plan / UC Course  I have reviewed the triage vital signs and the nursing notes.  Pertinent labs & imaging results that were available during my care of the patient were reviewed by me and considered in my medical decision making (see chart for details).  35 year old male presenting for suspected infection of right lower leg that he noticed yesterday.  Patient says that he pulled a tick off the same area a week ago.  Temperature in clinic is 99.7 degrees.  Patient has chills.  He has a small pustule of the right anterior lower leg with surrounding erythema and warmth.  Tenderness of this area as well as the right medial thigh.  Patient given 60 mg IM ketorolac in clinic for pain.  Treating patient's cellulitis at this time with doxycycline especially since he had a tick bite recently.  I have also sent an ibuprofen 800 mg and advised him to use warm compresses and take Tylenol for pain relief.  Advised monitoring the area closely and if he is not improving in the next couple of days or if his symptoms are worsening needs to be seen again.  Advised that he may need to go to the ED for any severe acute worsening of symptoms.  I did review the ED note  from [facility] today.  Patient had normal CBC and CMP.  Negative COVID test.  Reviewed this result with patient.  Work note given.   Final Clinical Impressions(s) / UC Diagnoses   Final diagnoses:  Cellulitis of right lower extremity  Chills  Pain of right lower extremity     Discharge Instructions      You have an infection of your lower leg.  Unsure if this is related to the tick bite.  I have sent in an antibiotic.  I have also sent in 800 mg ibuprofen.  Put warm compresses on the lower leg and try to keep it elevated.  You can also take Tylenol for pain.  This should start looking better in the next couple of days but if it does not or you have increased pain or fever you need to go to the emergency department.     ED Prescriptions     Medication Sig Dispense Auth. Provider   doxycycline (VIBRAMYCIN) 100 MG capsule Take 1 capsule (100 mg total) by mouth 2 (two) times daily for 10 days. 20 capsule Eusebio Friendly B, PA-C   ibuprofen (ADVIL) 800 MG tablet Take 1 tablet (800 mg total) by mouth every 8 (eight) hours as needed for moderate pain or fever. 21 tablet Shirlee Latch, PA-C      PDMP not reviewed this encounter.   Shirlee Latch, PA-C 05/31/21 2019

## 2021-05-31 NOTE — ED Triage Notes (Addendum)
Pt presents with complaints of insect bite to the front of his right leg. Reports he remembers pulling a tick off in that area a week ago. The area is now red raised with purulent drainage. Patient is having pain up into his right thigh. Pt endorses cold chills.

## 2021-11-22 ENCOUNTER — Encounter: Payer: Self-pay | Admitting: Licensed Clinical Social Worker

## 2021-11-22 ENCOUNTER — Ambulatory Visit
Admission: EM | Admit: 2021-11-22 | Discharge: 2021-11-22 | Disposition: A | Discharge status: Home / Self Care | Payer: Self-pay | Attending: Emergency Medicine | Admitting: Emergency Medicine

## 2021-11-22 ENCOUNTER — Other Ambulatory Visit: Payer: Self-pay

## 2021-11-22 DIAGNOSIS — U071 COVID-19: Secondary | ICD-10-CM

## 2021-11-22 MED ORDER — IPRATROPIUM BROMIDE 0.06 % NA SOLN: 2.0000 | Freq: Four times a day (QID) | NASAL | 12 refills | Status: DC

## 2021-11-22 MED ORDER — MOLNUPIRAVIR EUA 200MG CAPSULE: 4.0000 | ORAL_CAPSULE | Freq: Two times a day (BID) | ORAL | 0 refills | Status: AC

## 2021-11-22 MED ORDER — BENZONATATE 100 MG PO CAPS: 200.0000 mg | ORAL_CAPSULE | Freq: Three times a day (TID) | ORAL | 0 refills | Status: DC

## 2021-11-22 MED ORDER — PROMETHAZINE-DM 6.25-15 MG/5ML PO SYRP: 5.0000 mL | ORAL_SOLUTION | Freq: Four times a day (QID) | ORAL | 0 refills | Status: DC | PRN

## 2021-11-22 NOTE — ED Triage Notes (Signed)
Pt c/o of chills, sore throat, body aches, fever. Sxs x 2 days. Covid test positive.

## 2021-11-22 NOTE — Discharge Instructions (Addendum)
You will have to quarantine for 5 days from the start of your symptoms.  After 5 days you can break quarantine if your symptoms have improved and you have not had a fever for 24 hours without taking Tylenol or ibuprofen.  Use over-the-counter Tylenol and ibuprofen as needed for body aches and fever.  Use the Atrovent nasal spray, 2 squirts in each nostril every 6 hours, as needed for runny nose and postnasal drip.  Use the Tessalon Perles every 8 hours during the day.  Take them with a small sip of water.  They may give you some numbness to the base of your tongue or a metallic taste in your mouth, this is normal.  Use the Promethazine DM cough syrup at bedtime for cough and congestion.  It will make you drowsy so do not take it during the day.  Take the molnupiravir twice daily for 5 days for treatment of COVID.  If you develop any increased shortness of breath-especially at rest, you are unable to speak in full sentences, or is a late sign your lips are turning blue you need to go the ER for evaluation.

## 2021-11-22 NOTE — ED Provider Notes (Signed)
[facility]    CSN: [id number] Arrival date & time: 11/22/21  0804      History   Chief Complaint Chief Complaint  Patient presents with   Sore Throat   Chills    HPI Reginald Terrell is a 35 y.o. male.   HPI  35 year old male here for evaluation of respiratory complaints.  Patient reports that for the last 2 days she has been experiencing a subjective fever, runny nose and nasal congestion, right ear pain, cough that is productive for green sputum, decreased appetite, chills, body aches, and sore throat.  He denies any shortness of breath or wheezing he denies GI complaints.  He tested positive for COVID today at home.  Past Medical History:  Diagnosis Date   Irregular heart beat     There are no problems to display for this patient.   History reviewed. No pertinent surgical history.     Home Medications    Prior to Admission medications   Medication Sig Start Date End Date Taking? Authorizing Provider  benzonatate (TESSALON) 100 MG capsule Take 2 capsules (200 mg total) by mouth every 8 (eight) hours. 11/22/21  Yes Becky Augusta, NP  ipratropium (ATROVENT) 0.06 % nasal spray Place 2 sprays into both nostrils 4 (four) times daily. 11/22/21  Yes Becky Augusta, NP  molnupiravir EUA (LAGEVRIO) 200 mg CAPS capsule Take 4 capsules (800 mg total) by mouth 2 (two) times daily for 5 days. 11/22/21 11/27/21 Yes Becky Augusta, NP  promethazine-dextromethorphan (PROMETHAZINE-DM) 6.25-15 MG/5ML syrup Take 5 mLs by mouth 4 (four) times daily as needed. 11/22/21  Yes Becky Augusta, NP  ibuprofen (ADVIL) 800 MG tablet Take 1 tablet (800 mg total) by mouth every 8 (eight) hours as needed for moderate pain or fever. 05/31/21   Shirlee Latch, PA-C    Family History Family History  Problem Relation Age of Onset   Healthy Mother    Healthy Father     Social History Social History   Tobacco Use   Smoking status: Never   Smokeless tobacco: Never  Substance Use Topics    Alcohol use: No   Drug use: No     Allergies   Penicillins   Review of Systems Review of Systems  Constitutional:  Positive for appetite change, chills, diaphoresis and fever. Negative for activity change.  HENT:  Positive for congestion, ear pain, rhinorrhea and sore throat.   Respiratory:  Positive for cough. Negative for shortness of breath and wheezing.   Gastrointestinal:  Negative for diarrhea, nausea and vomiting.  Musculoskeletal:  Positive for arthralgias and myalgias.  Skin:  Negative for rash.  Neurological:  Positive for headaches.  Hematological: Negative.   Psychiatric/Behavioral: Negative.      Physical Exam Triage Vital Signs ED Triage Vitals [11/22/21 0818]  Enc Vitals Group     BP      Pulse      Resp      Temp      Temp src      SpO2      Weight 240 lb (108.9 kg)     Height 6\' 3"  (1.905 m)     Head Circumference      Peak Flow      Pain Score 10     Pain Loc      Pain Edu?      Excl. in GC?    No data found.  Updated Vital Signs BP 131/90 (BP Location: Left Arm)    Pulse  97    Temp 98.9 F (37.2 C) (Oral)    Resp 16    Ht 6\' 3"  (1.905 m)    Wt 240 lb (108.9 kg)    SpO2 99%    BMI 30.00 kg/m   Visual Acuity Right Eye Distance:   Left Eye Distance:   Bilateral Distance:    Right Eye Near:   Left Eye Near:    Bilateral Near:     Physical Exam Vitals and nursing note reviewed.  Constitutional:      General: He is not in acute distress.    Appearance: Normal appearance. He is ill-appearing.  HENT:     Head: Normocephalic and atraumatic.     Right Ear: Tympanic membrane, ear canal and external ear normal. There is no impacted cerumen.     Left Ear: Tympanic membrane, ear canal and external ear normal. There is no impacted cerumen.     Nose: Congestion and rhinorrhea present.     Mouth/Throat:     Mouth: Mucous membranes are moist.     Pharynx: Oropharynx is clear. Posterior oropharyngeal erythema present.  Cardiovascular:     Rate  and Rhythm: Normal rate and regular rhythm.     Pulses: Normal pulses.     Heart sounds: Normal heart sounds. No murmur heard.   No gallop.  Pulmonary:     Effort: Pulmonary effort is normal.     Breath sounds: Normal breath sounds. No wheezing, rhonchi or rales.  Musculoskeletal:     Cervical back: Normal range of motion and neck supple.  Lymphadenopathy:     Cervical: No cervical adenopathy.  Skin:    General: Skin is warm and dry.     Capillary Refill: Capillary refill takes less than 2 seconds.     Findings: No erythema or rash.  Neurological:     General: No focal deficit present.     Mental Status: He is alert and oriented to person, place, and time.  Psychiatric:        Mood and Affect: Mood normal.        Behavior: Behavior normal.        Thought Content: Thought content normal.        Judgment: Judgment normal.     UC Treatments / Results  Labs (all labs ordered are listed, but only abnormal results are displayed) Labs Reviewed - No data to display  EKG   Radiology No results found.  Procedures Procedures (including critical care time)  Medications Ordered in UC Medications - No data to display  Initial Impression / Assessment and Plan / UC Course  I have reviewed the triage vital signs and the nursing notes.  Pertinent labs & imaging results that were available during my care of the patient were reviewed by me and considered in my medical decision making (see chart for details).  Patient is a pleasant though ill-appearing 35 year old male here for evaluation of COVID symptoms that began 2 days ago as outlined in HPI above.  He did test positive at home for COVID.  On physical exam patient has pearly gray tympanic membranes bilaterally with normal light reflex and clear external auditory canals.  His nasal mucosa is erythematous and edematous with clear discharge in both nares.  Oropharyngeal exam reveals posterior oropharyngeal erythema and injection with  clear postnasal drip.  Lungs are unremarkable.  No cervical of adenopathy appreciated exam.  Cardiopulmonary exam reveals clear lung sounds in all fields.  Patient's exam is consistent with  a viral respiratory infection and he is positive for COVID at home I do not feel there is need to retest him here.  He has a history of irregular heartbeat so I will place him on molnupiravir twice daily for 5 days.  Losq of Atrovent nasal spray to help with nasal congestion, Tessalon Perles and Promethazine DM cough syrup to help with cough and congestion.  Work note provided to cover the quarantine duration.   Final Clinical Impressions(s) / UC Diagnoses   Final diagnoses:  COVID-19     Discharge Instructions      You will have to quarantine for 5 days from the start of your symptoms.  After 5 days you can break quarantine if your symptoms have improved and you have not had a fever for 24 hours without taking Tylenol or ibuprofen.  Use over-the-counter Tylenol and ibuprofen as needed for body aches and fever.  Use the Atrovent nasal spray, 2 squirts in each nostril every 6 hours, as needed for runny nose and postnasal drip.  Use the Tessalon Perles every 8 hours during the day.  Take them with a small sip of water.  They may give you some numbness to the base of your tongue or a metallic taste in your mouth, this is normal.  Use the Promethazine DM cough syrup at bedtime for cough and congestion.  It will make you drowsy so do not take it during the day.  Take the molnupiravir twice daily for 5 days for treatment of COVID.  If you develop any increased shortness of breath-especially at rest, you are unable to speak in full sentences, or is a late sign your lips are turning blue you need to go the ER for evaluation.      ED Prescriptions     Medication Sig Dispense Auth. Provider   molnupiravir EUA (LAGEVRIO) 200 mg CAPS capsule Take 4 capsules (800 mg total) by mouth 2 (two) times daily for 5  days. 40 capsule Becky Augusta, NP   benzonatate (TESSALON) 100 MG capsule Take 2 capsules (200 mg total) by mouth every 8 (eight) hours. 21 capsule Becky Augusta, NP   ipratropium (ATROVENT) 0.06 % nasal spray Place 2 sprays into both nostrils 4 (four) times daily. 15 mL Becky Augusta, NP   promethazine-dextromethorphan (PROMETHAZINE-DM) 6.25-15 MG/5ML syrup Take 5 mLs by mouth 4 (four) times daily as needed. 118 mL Becky Augusta, NP      PDMP not reviewed this encounter.   Becky Augusta, NP 11/22/21 [phone]

## 2023-09-18 ENCOUNTER — Ambulatory Visit
Admission: EM | Admit: 2023-09-18 | Discharge: 2023-09-18 | Disposition: A | Discharge status: Home / Self Care | Payer: No Typology Code available for payment source | Attending: Physician Assistant | Admitting: Physician Assistant

## 2023-09-18 DIAGNOSIS — B349 Viral infection, unspecified: Secondary | ICD-10-CM | POA: Insufficient documentation

## 2023-09-18 DIAGNOSIS — R52 Pain, unspecified: Secondary | ICD-10-CM | POA: Insufficient documentation

## 2023-09-18 DIAGNOSIS — Z1152 Encounter for screening for COVID-19: Secondary | ICD-10-CM | POA: Insufficient documentation

## 2023-09-18 DIAGNOSIS — J029 Acute pharyngitis, unspecified: Secondary | ICD-10-CM | POA: Diagnosis present

## 2023-09-18 LAB — SARS CORONAVIRUS 2 BY RT PCR: SARS Coronavirus 2 by RT PCR: NEGATIVE

## 2023-09-18 LAB — GROUP A STREP BY PCR: Group A Strep by PCR: NOT DETECTED

## 2023-09-18 MED ORDER — PROMETHAZINE-DM 6.25-15 MG/5ML PO SYRP: 5.0000 mL | ORAL_SOLUTION | Freq: Four times a day (QID) | ORAL | 0 refills | Status: AC | PRN

## 2023-09-18 NOTE — Discharge Instructions (Addendum)
-

## 2023-09-18 NOTE — ED Triage Notes (Signed)
Patient states that he has had a sore throat and body aches headache. x 1 day. Son was just dx with strep.

## 2023-09-18 NOTE — ED Provider Notes (Signed)
[facility]    CSN: [id number] Arrival date & time: 09/18/23  1332      History   Chief Complaint Chief Complaint  Patient presents with   Headache   Sore Throat    HPI Reginald Terrell is a 37 y.o. male presenting for sore throat, body aches, headaches, fatigue, cough and congestion for the past 2 days.  Denies fever, breathing ability wheezing, vomiting or diarrhea.  Reports his son was recently diagnosed with strep today.  Patient has taken OTC meds.  HPI  Past Medical History:  Diagnosis Date   Irregular heart beat     There are no problems to display for this patient.   History reviewed. No pertinent surgical history.     Home Medications    Prior to Admission medications   Medication Sig Start Date End Date Taking? Authorizing Provider  promethazine-dextromethorphan (PROMETHAZINE-DM) 6.25-15 MG/5ML syrup Take 5 mLs by mouth 4 (four) times daily as needed. 09/18/23  Yes Shirlee Latch, PA-C    Family History Family History  Problem Relation Age of Onset   Healthy Mother    Healthy Father     Social History Social History   Tobacco Use   Smoking status: Never    Passive exposure: Never   Smokeless tobacco: Never  Vaping Use   Vaping status: Never Used  Substance Use Topics   Alcohol use: No   Drug use: No     Allergies   Penicillins   Review of Systems Review of Systems  Constitutional:  Positive for fatigue. Negative for fever.  HENT:  Positive for congestion, rhinorrhea and sore throat. Negative for sinus pressure and sinus pain.   Respiratory:  Positive for cough. Negative for shortness of breath.   Cardiovascular:  Negative for chest pain.  Gastrointestinal:  Negative for abdominal pain, diarrhea, nausea and vomiting.  Musculoskeletal:  Positive for myalgias.  Neurological:  Positive for headaches. Negative for weakness and light-headedness.  Hematological:  Negative for adenopathy.     Physical Exam Triage Vital  Signs ED Triage Vitals  Encounter Vitals Group     BP 09/18/23 1449 (!) 159/104     Systolic BP Percentile --      Diastolic BP Percentile --      Pulse Rate 09/18/23 1449 86     Resp 09/18/23 1449 18     Temp 09/18/23 1449 98.8 F (37.1 C)     Temp Source 09/18/23 1449 Oral     SpO2 09/18/23 1449 99 %     Weight --      Height --      Head Circumference --      Peak Flow --      Pain Score 09/18/23 1448 5     Pain Loc --      Pain Education --      Exclude from Growth Chart --    No data found.  Updated Vital Signs BP (!) 159/104 (BP Location: Left Arm)   Pulse 86   Temp 98.8 F (37.1 C) (Oral)   Resp 18   SpO2 99%      Physical Exam Vitals and nursing note reviewed.  Constitutional:      General: He is not in acute distress.    Appearance: Normal appearance. He is well-developed. He is not ill-appearing.  HENT:     Head: Normocephalic and atraumatic.     Nose: Congestion present.     Mouth/Throat:     Mouth:  Mucous membranes are moist.     Pharynx: Oropharynx is clear. Posterior oropharyngeal erythema present.  Eyes:     General: No scleral icterus.    Conjunctiva/sclera: Conjunctivae normal.  Cardiovascular:     Rate and Rhythm: Normal rate and regular rhythm.     Heart sounds: Normal heart sounds.  Pulmonary:     Effort: Pulmonary effort is normal. No respiratory distress.     Breath sounds: Normal breath sounds.  Musculoskeletal:     Cervical back: Neck supple.  Skin:    General: Skin is warm and dry.     Capillary Refill: Capillary refill takes less than 2 seconds.  Neurological:     General: No focal deficit present.     Mental Status: He is alert. Mental status is at baseline.     Motor: No weakness.     Gait: Gait normal.  Psychiatric:        Mood and Affect: Mood normal.      UC Treatments / Results  Labs (all labs ordered are listed, but only abnormal results are displayed) Labs Reviewed  GROUP A STREP BY PCR  SARS CORONAVIRUS 2 BY  RT PCR    EKG   Radiology No results found.  Procedures Procedures (including critical care time)  Medications Ordered in UC Medications - No data to display  Initial Impression / Assessment and Plan / UC Course  I have reviewed the triage vital signs and the nursing notes.  Pertinent labs & imaging results that were available during my care of the patient were reviewed by me and considered in my medical decision making (see chart for details).   37 year old male presents for 2-day history of fatigue, sore throat, congestion, cough, headaches and bodyaches.  Has been exposed to strep.  Patient is afebrile.  Overall well-appearing.  No acute distress.  On exam has nasal congestion and erythema posterior pharynx.  Chest clear.  Strep and COVID testing obtained. All negative.   Viral URI. Supportive care. Sent promethazine DM. Reviewed return and ED precautions.   Final Clinical Impressions(s) / UC Diagnoses   Final diagnoses:  Viral illness  Sore throat  Body aches     Discharge Instructions      -Negative strep and COVID  URI/COLD SYMPTOMS: Your exam today is consistent with a viral illness. Antibiotics are not indicated at this time. Use medications as directed, including cough syrup, nasal saline, and decongestants. Your symptoms should improve over the next few days and resolve within 7-10 days. Increase rest and fluids. F/u if symptoms worsen or predominate such as sore throat, ear pain, productive cough, shortness of breath, or if you develop high fevers or worsening fatigue over the next several days.       ED Prescriptions     Medication Sig Dispense Auth. Provider   promethazine-dextromethorphan (PROMETHAZINE-DM) 6.25-15 MG/5ML syrup Take 5 mLs by mouth 4 (four) times daily as needed. 118 mL Shirlee Latch, PA-C      PDMP not reviewed this encounter.   Shirlee Latch, PA-C 09/18/23 1554

## 2023-09-19 ENCOUNTER — Ambulatory Visit: Payer: Self-pay

## 2024-06-02 ENCOUNTER — Ambulatory Visit (INDEPENDENT_AMBULATORY_CARE_PROVIDER_SITE_OTHER)

## 2024-06-02 ENCOUNTER — Ambulatory Visit
Admission: EM | Admit: 2024-06-02 | Discharge: 2024-06-02 | Disposition: A | Discharge status: Home / Self Care | Attending: Emergency Medicine | Admitting: Emergency Medicine

## 2024-06-02 ENCOUNTER — Encounter: Payer: Self-pay | Admitting: Emergency Medicine

## 2024-06-02 DIAGNOSIS — Z8739 Personal history of other diseases of the musculoskeletal system and connective tissue: Secondary | ICD-10-CM | POA: Diagnosis not present

## 2024-06-02 DIAGNOSIS — M545 Low back pain, unspecified: Secondary | ICD-10-CM

## 2024-06-02 HISTORY — DX: Low back pain, unspecified: M54.50

## 2024-06-02 MED ORDER — KETOROLAC TROMETHAMINE 30 MG/ML IJ SOLN
30.0000 mg | Freq: Once | INTRAMUSCULAR | Status: AC
  Administered 2024-06-02: 30 mg via INTRAMUSCULAR

## 2024-06-02 MED ORDER — MELOXICAM 7.5 MG PO TABS: 7.5000 mg | ORAL_TABLET | Freq: Every day | ORAL | 0 refills | Status: AC

## 2024-06-02 MED ORDER — CYCLOBENZAPRINE HCL 10 MG PO TABS: 10.0000 mg | ORAL_TABLET | Freq: Two times a day (BID) | ORAL | 0 refills | Status: AC | PRN

## 2024-06-02 NOTE — Discharge Instructions (Addendum)
 You were given an injection of Toradol  today.  Do not take any other NSAIDs today (such as ibuprofen , Aleve, Advil , Motrin , etc.).    Start the meloxicam  tomorrow.    Take the cyclobenzaprine  as directed.  Do not drive, operate machinery, drink alcohol, or perform dangerous activities while taking this medication as it may cause drowsiness.  Follow-up with your primary care provider or an orthopedist.

## 2024-06-02 NOTE — ED Triage Notes (Signed)
 Patient to Urgent Care with complaints of lower back pain. Denies any radiation.   Symptoms x2 days after a fall (reports chronic lower back pain). States that he was sweeping and fell onto his knees onto concrete.   Wearing back brace/ ibuprofen / icing/ heating pad.

## 2024-06-02 NOTE — ED Provider Notes (Signed)
 Reginald Terrell    CSN: [id number] Arrival date & time: 06/02/24  0904      History   Chief Complaint Chief Complaint  Patient presents with   Back Pain    HPI Reginald Terrell is a 38 y.o. male.  Accompanied by his wife, patient presents with left lower back pain x 2 days which started while he was sweeping and his back gave out causing him to fall to his knees.  He took ibuprofen ; last dose taken yesterday.  He denies numbness, weakness, paresthesias, saddle anesthesia, loss of bowel/bladder control, abdominal pain, dysuria, hematuria.  His medical history includes chronic low back pain; seen by [facility] on 01/17/2023.   The history is provided by the patient, the spouse and medical records.    Past Medical History:  Diagnosis Date   Chronic low back pain    Irregular heart beat     There are no active problems to display for this patient.   History reviewed. No pertinent surgical history.     Home Medications    Prior to Admission medications   Medication Sig Start Date End Date Taking? Authorizing Provider  cyclobenzaprine  (FLEXERIL ) 10 MG tablet Take 1 tablet (10 mg total) by mouth 2 (two) times daily as needed for muscle spasms. 06/02/24  Yes Corlis Burnard DEL, NP  meloxicam  (MOBIC ) 7.5 MG tablet Take 1 tablet (7.5 mg total) by mouth daily. 06/03/24  Yes Corlis Burnard DEL, NP  promethazine -dextromethorphan (PROMETHAZINE -DM) 6.25-15 MG/5ML syrup Take 5 mLs by mouth 4 (four) times daily as needed. Patient not taking: Reported on 06/02/2024 09/18/23   Arvis Jolan KATHEE DEVONNA    Family History Family History  Problem Relation Age of Onset   Healthy Mother    Healthy Father     Social History Social History   Tobacco Use   Smoking status: Never    Passive exposure: Never   Smokeless tobacco: Never  Vaping Use   Vaping status: Never Used  Substance Use Topics   Alcohol use: No   Drug use: No     Allergies   Penicillins   Review of Systems Review of  Systems  Constitutional:  Negative for chills and fever.  Gastrointestinal:  Negative for abdominal pain, constipation, diarrhea and vomiting.  Genitourinary:  Negative for dysuria and hematuria.  Musculoskeletal:  Positive for back pain. Negative for arthralgias, gait problem and joint swelling.  Skin:  Negative for color change, rash and wound.  Neurological:  Negative for syncope, weakness and numbness.     Physical Exam Triage Vital Signs ED Triage Vitals  Encounter Vitals Group     BP      Girls Systolic BP Percentile      Girls Diastolic BP Percentile      Boys Systolic BP Percentile      Boys Diastolic BP Percentile      Pulse      Resp      Temp      Temp src      SpO2      Weight      Height      Head Circumference      Peak Flow      Pain Score      Pain Loc      Pain Education      Exclude from Growth Chart    No data found.  Updated Vital Signs BP 125/83   Pulse 84   Temp 97.9 F (36.6  C)   Resp 18   SpO2 99%   Visual Acuity Right Eye Distance:   Left Eye Distance:   Bilateral Distance:    Right Eye Near:   Left Eye Near:    Bilateral Near:     Physical Exam Constitutional:      General: He is not in acute distress. HENT:     Mouth/Throat:     Mouth: Mucous membranes are moist.  Cardiovascular:     Rate and Rhythm: Normal rate and regular rhythm.  Pulmonary:     Effort: Pulmonary effort is normal. No respiratory distress.  Abdominal:     General: Bowel sounds are normal.     Palpations: Abdomen is soft.     Tenderness: There is no abdominal tenderness. There is no right CVA tenderness, left CVA tenderness, guarding or rebound.  Musculoskeletal:        General: No swelling, tenderness or deformity. Normal range of motion.  Skin:    General: Skin is warm and dry.     Capillary Refill: Capillary refill takes less than 2 seconds.     Findings: No bruising, erythema, lesion or rash.  Neurological:     General: No focal deficit present.      Mental Status: He is alert and oriented to person, place, and time.     Sensory: No sensory deficit.     Motor: No weakness.     Gait: Gait normal.      UC Treatments / Results  Labs (all labs ordered are listed, but only abnormal results are displayed) Labs Reviewed - No data to display  EKG   Radiology DG Lumbar Spine Complete Result Date: 06/02/2024 CLINICAL DATA:  pain s/p fall 2 days ago.  Hx chronic low back pain. EXAM: LUMBAR SPINE - COMPLETE 4+ VIEW COMPARISON:  05/31/2016. FINDINGS: There are 5 nonrib-bearing lumbar vertebrae. Anatomic lumbar curvature. No spondylolysis or spondylolisthesis. Vertebral body heights are maintained. No aggressive osseous lesion. Intervertebral disc heights are maintained. No significant degenerative changes. Sacroiliac joints are symmetric. Visualized soft tissues are within normal limits. IMPRESSION: *No acute osseous abnormality of the lumbar spine. Electronically Signed   By: Ree Molt M.D.   On: 06/02/2024 09:44    Procedures Procedures (including critical care time)  Medications Ordered in UC Medications  ketorolac  (TORADOL ) 30 MG/ML injection 30 mg (30 mg Intramuscular Given 06/02/24 0932)    Initial Impression / Assessment and Plan / UC Course  I have reviewed the triage vital signs and the nursing notes.  Pertinent labs & imaging results that were available during my care of the patient were reviewed by me and considered in my medical decision making (see chart for details).    Acute left lower back pain without sciatica, history of chronic low back pain.  Xray of lumbar spine negative.  Injection of Toradol  given here.  Starting meloxicam  tomorrow.  Also treating with cyclobenzaprine .  Precautions for drowsiness with cyclobenzaprine  discussed.  Education provided on acute and chronic back pain.  Instructed patient to follow-up with his PCP or an orthopedist.  Contact information for on-call Ortho provided.  Patient agrees to  plan of care.  Final Clinical Impressions(s) / UC Diagnoses   Final diagnoses:  Acute left-sided low back pain without sciatica  History of chronic back pain     Discharge Instructions      You were given an injection of Toradol  today.  Do not take any other NSAIDs today (such as ibuprofen , Aleve, Advil , Motrin ,  etc.).    Start the meloxicam  tomorrow.    Take the cyclobenzaprine  as directed.  Do not drive, operate machinery, drink alcohol, or perform dangerous activities while taking this medication as it may cause drowsiness.  Follow-up with your primary care provider or an orthopedist.      ED Prescriptions     Medication Sig Dispense Auth. Provider   meloxicam  (MOBIC ) 7.5 MG tablet Take 1 tablet (7.5 mg total) by mouth daily. 14 tablet Corlis Burnard DEL, NP   cyclobenzaprine  (FLEXERIL ) 10 MG tablet Take 1 tablet (10 mg total) by mouth 2 (two) times daily as needed for muscle spasms. 14 tablet Corlis Burnard DEL, NP      I have reviewed the PDMP during this encounter.   Corlis Burnard DEL, NP 06/02/24 [id number]
